# Patient Record
Sex: Male | Born: 2000 | Hispanic: Yes | Marital: Single | Smoking: Never smoker
Health system: Southern US, Community
[De-identification: ages and names within clinical notes are randomized; demographics above are authoritative.]

---
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
## 2022-03-22 ENCOUNTER — Ambulatory Visit: Payer: Self-pay | Admitting: *Deleted

## 2022-03-22 NOTE — Telephone Encounter (Signed)
3rd attempt to return pt's call.    I will close out this chart.    I used [employer] data processor for [employer] # [id number].   Left a voicemail to call back.

## 2022-03-22 NOTE — Telephone Encounter (Signed)
Message from [place] sent at 03/22/2022 12:04 PM EST  Summary: fever and diarrhea   Patient called in says feels feverish and diarrhea. He isnt an established patient          Call History   Type Contact Phone/Fax User  03/22/2022 12:03 PM EST Phone (Incoming) Ronnette Juniper, Jonathan Donovan (Self) [phone] Bayard Beaver

## 2022-03-22 NOTE — Telephone Encounter (Signed)
2nd attempt, Patient called, left VM to return the call to the office to discuss symptoms with a nurse. Interpreter Christin [phone]

## 2022-03-22 NOTE — Telephone Encounter (Signed)
Attempted to return his call.   Used [employer] [phone] for Spanish.    Left a voicemail to call back.
